# Patient Record
Sex: Female | Born: 1940 | Race: Black or African American | Hispanic: No | Smoking: Never smoker
Health system: Southern US, Community
[De-identification: ages and names within clinical notes are randomized; demographics above are authoritative.]

## PROBLEM LIST (undated history)

## (undated) DIAGNOSIS — E785 Hyperlipidemia, unspecified: Secondary | ICD-10-CM

## (undated) DIAGNOSIS — I10 Essential (primary) hypertension: Secondary | ICD-10-CM

## (undated) DIAGNOSIS — C50919 Malignant neoplasm of unspecified site of unspecified female breast: Secondary | ICD-10-CM

## (undated) HISTORY — PX: HERNIA REPAIR: SHX51

## (undated) HISTORY — PX: CHOLECYSTECTOMY: SHX55

## (undated) HISTORY — PX: MASTECTOMY: SHX3

---

## 2007-11-12 ENCOUNTER — Emergency Department (HOSPITAL_BASED_OUTPATIENT_CLINIC_OR_DEPARTMENT_OTHER): Admission: EM | Admit: 2007-11-12 | Discharge: 2007-11-12 | Payer: Self-pay | Admitting: Emergency Medicine

## 2009-08-20 IMAGING — CR DG CHEST 2V
2 series · 2 of 2 positions shown · non-contrast
Comparison: None

CLINICAL DATA: Near-syncope

CHEST - 2 VIEW

[w chest pa]
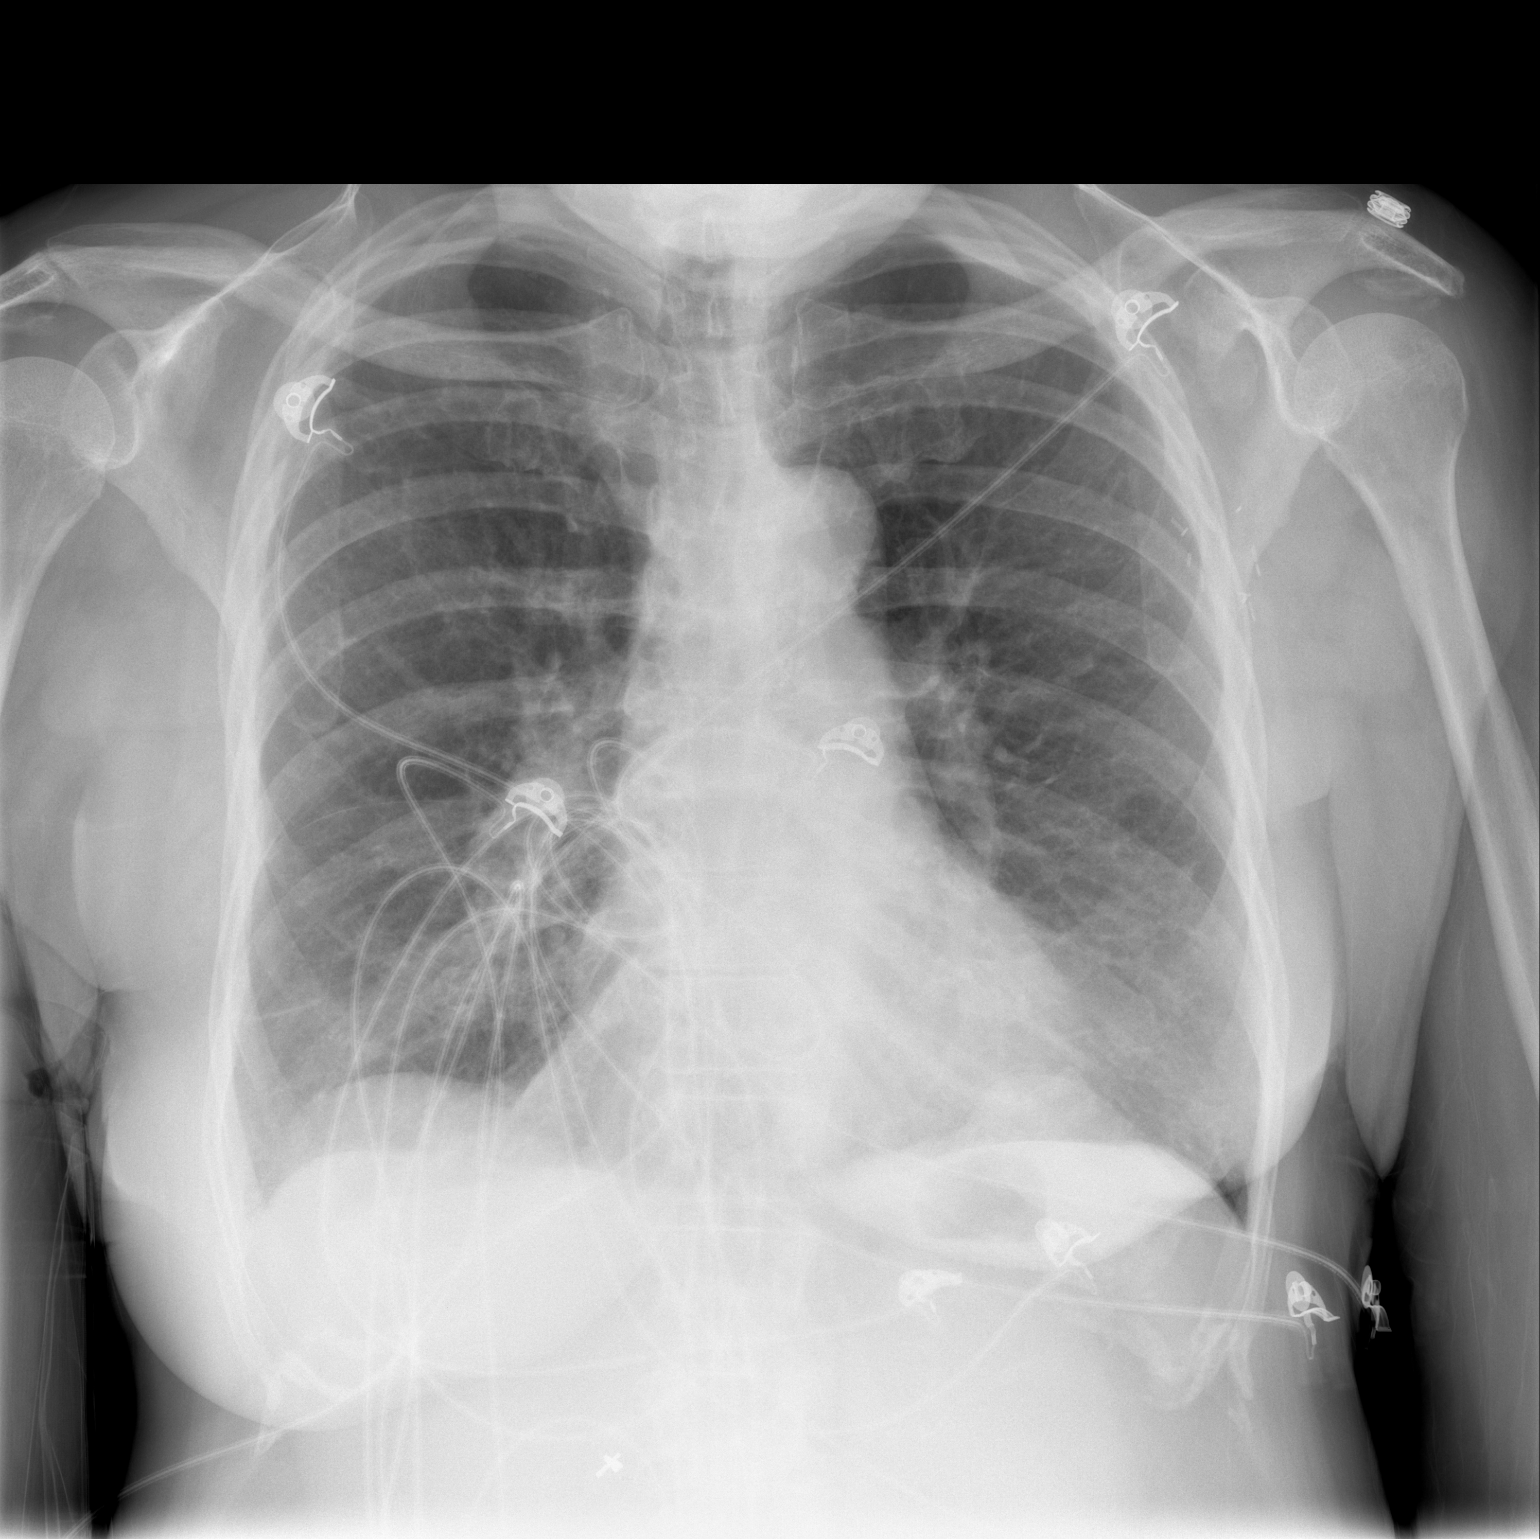

[w chest lat]
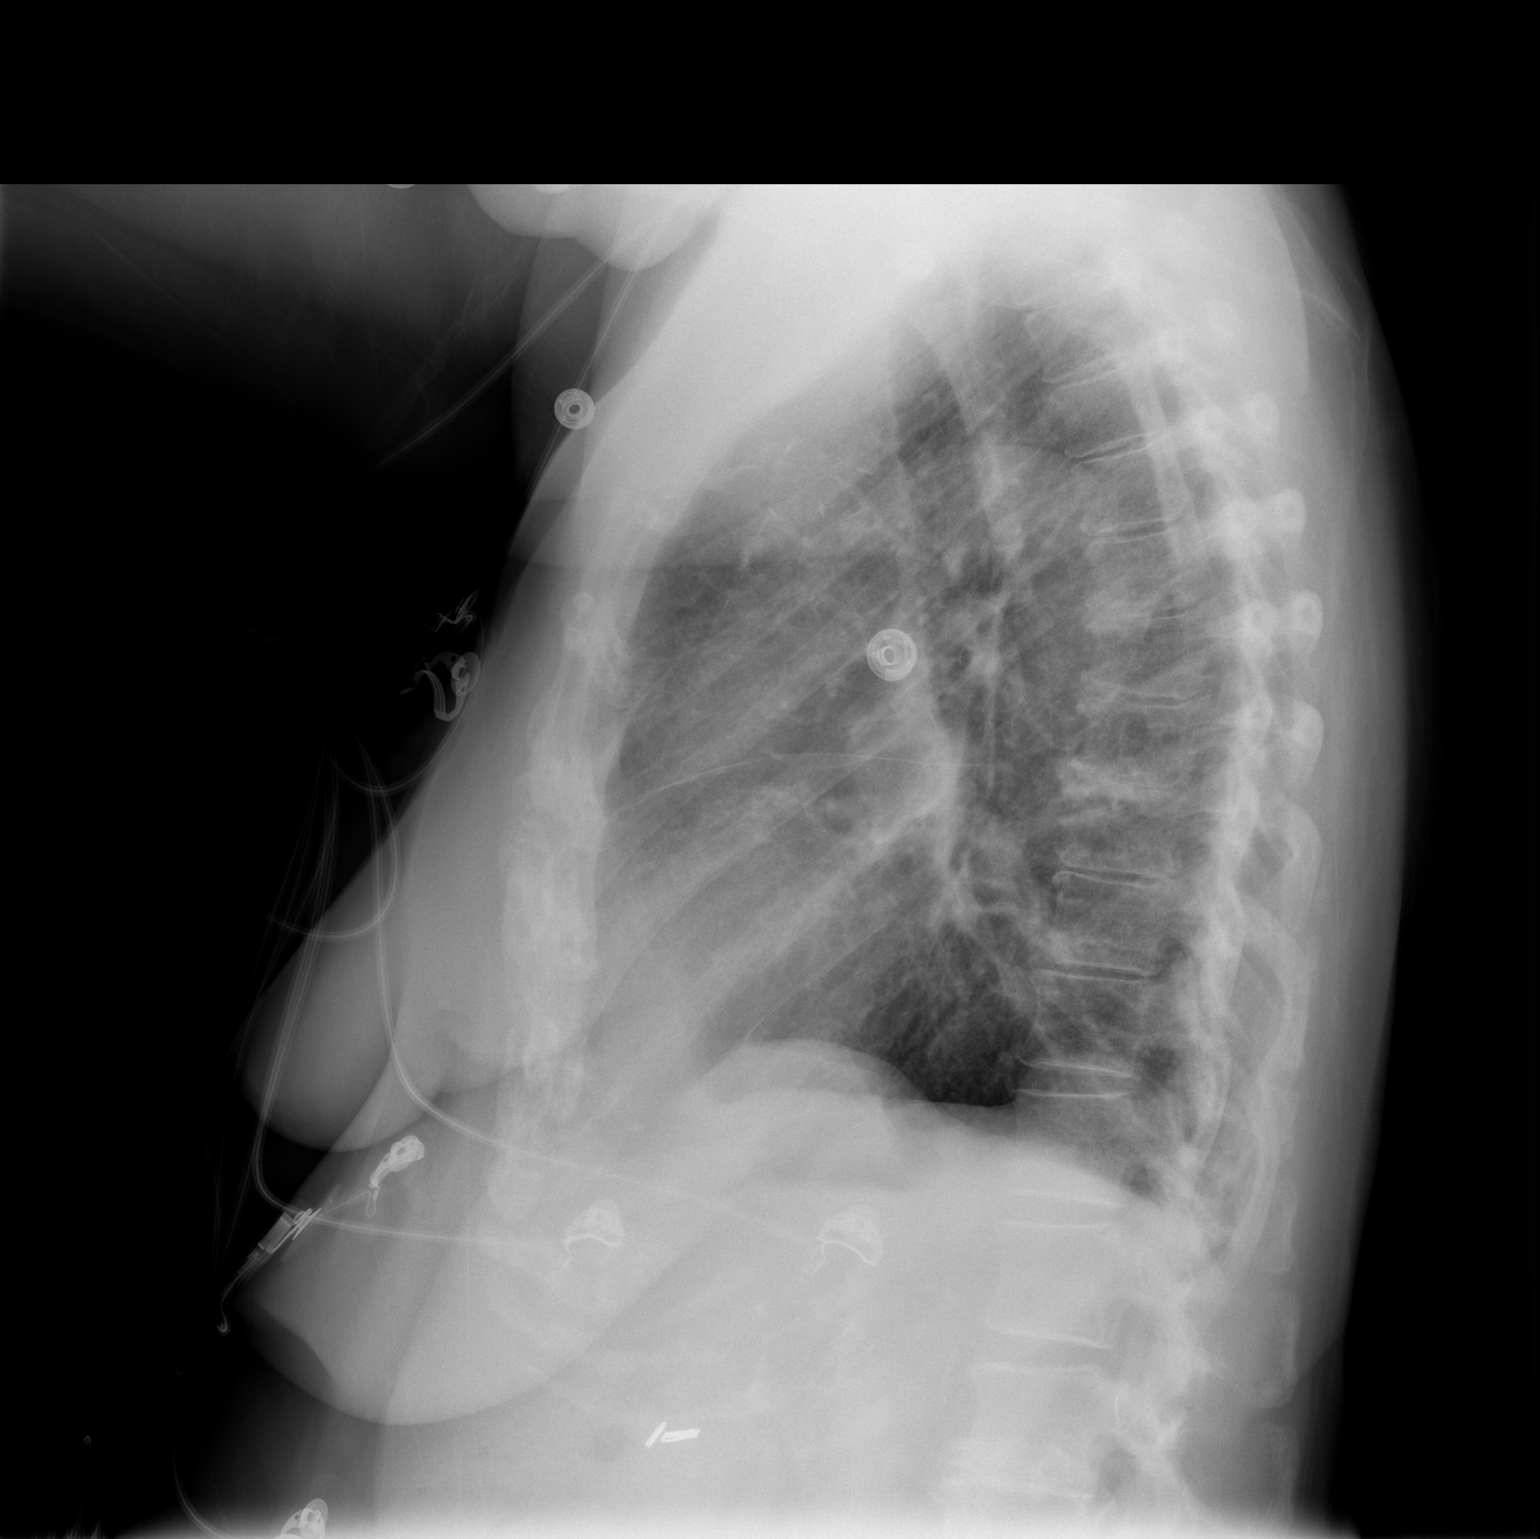

[2 of 2 positions shown; findings below may reference images not displayed]

FINDINGS: Heart size and mediastinal contours are unremarkable.

There is no pleural effusion.

No airspace consolidation identified.

There is mild interstitial prominence concerning for edema.
IMPRESSION: 1.  Suspect mild interstitial edema.

## 2009-08-20 IMAGING — CT CT HEAD W/O CM
1 series · 16 of 30 positions shown, 20 images · non-contrast
Comparison: None

CLINICAL DATA: Syncope.  Hypertension.

CT HEAD WITHOUT CONTRAST
TECHNIQUE: Contiguous axial images were obtained from the base of
the skull through the vertex without contrast.

[Series 2: head 4.8 h37s · axial · 0.42mm/px · z∈[-153,-13]mm · 16 of 32 slices shown, 20 images]
[im 2/32  brain]
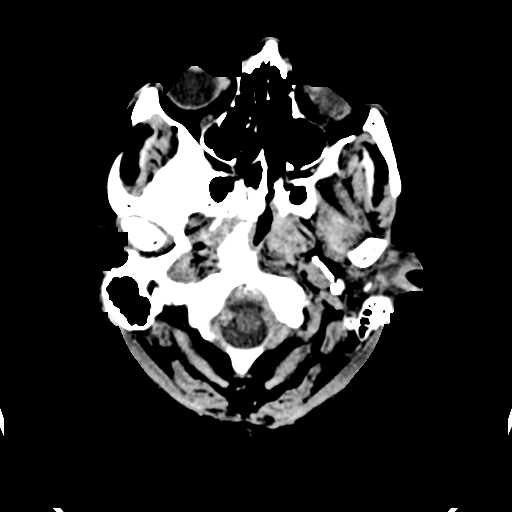
[im 2/32  bone]
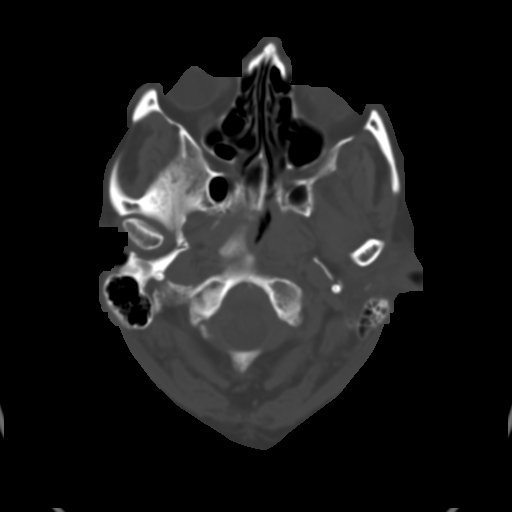
[im 4/32  brain]
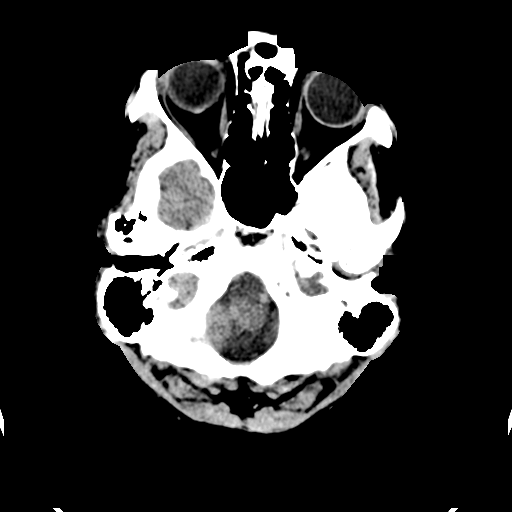
[im 6/32  brain]
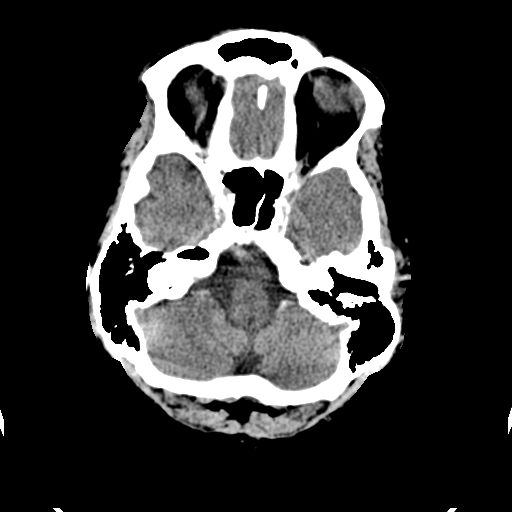
[im 8/32  brain]
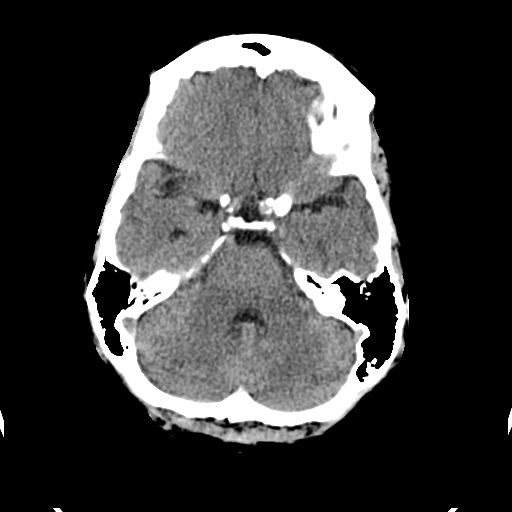
[im 9/32  brain]
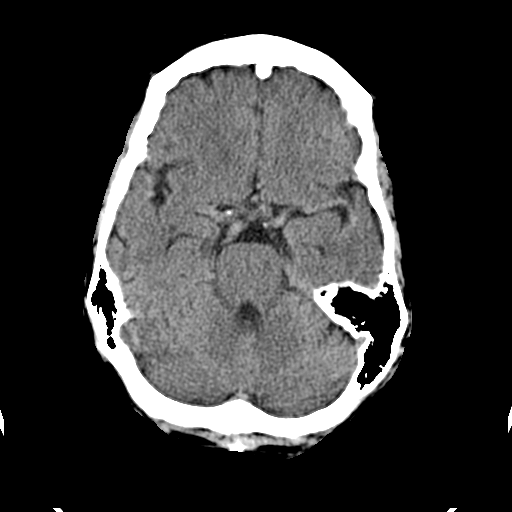
[im 9/32  bone]
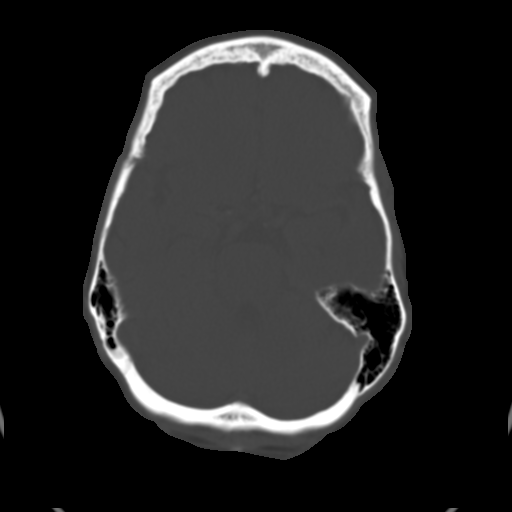
[im 11/32  brain]
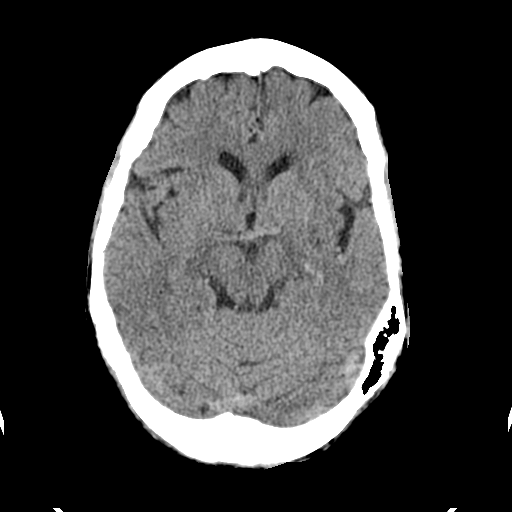
[im 13/32  brain]
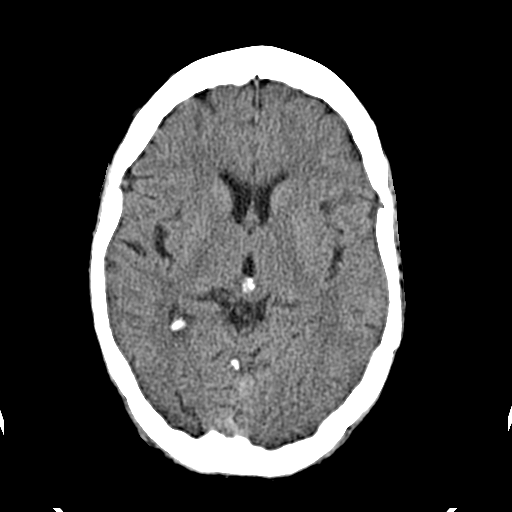
[im 15/32  brain]
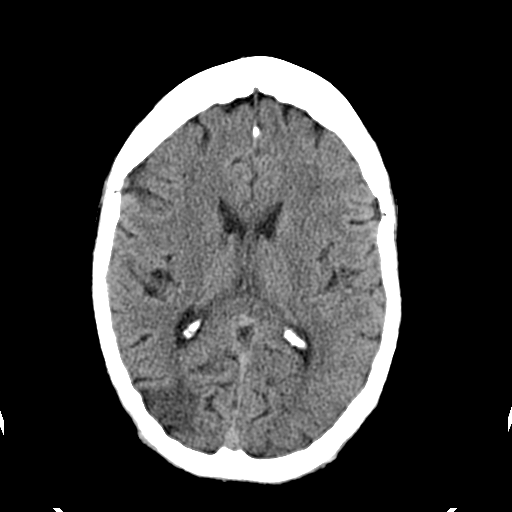
[im 17/32  brain]
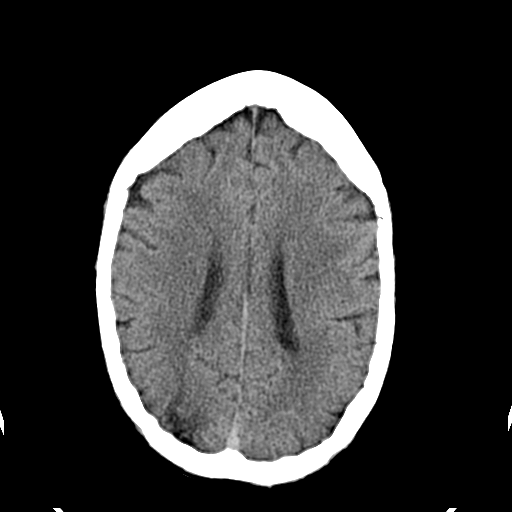
[im 17/32  bone]
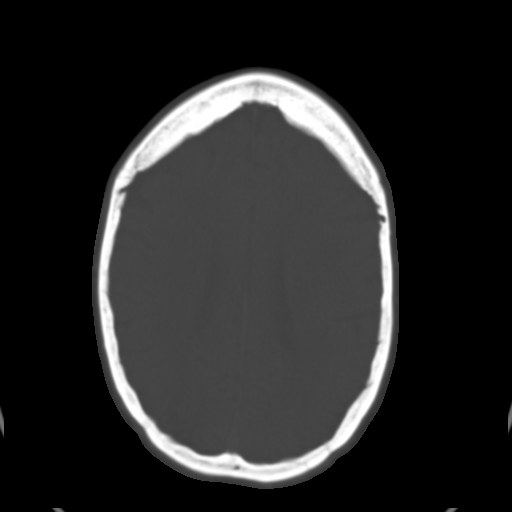
[im 19/32  brain]
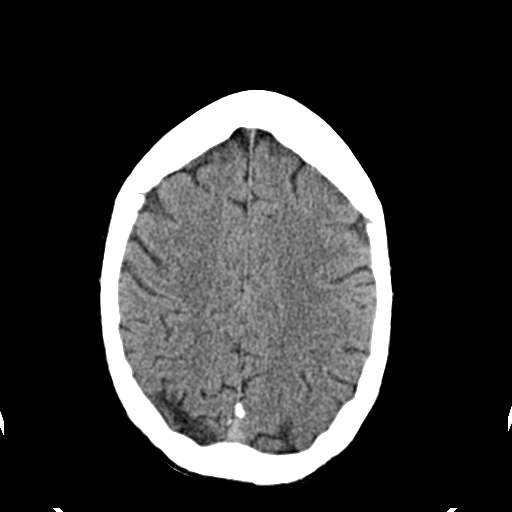
[im 21/32  brain]
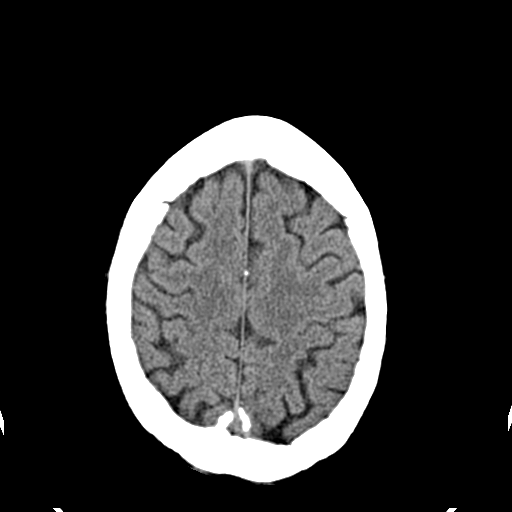
[im 23/32  brain]
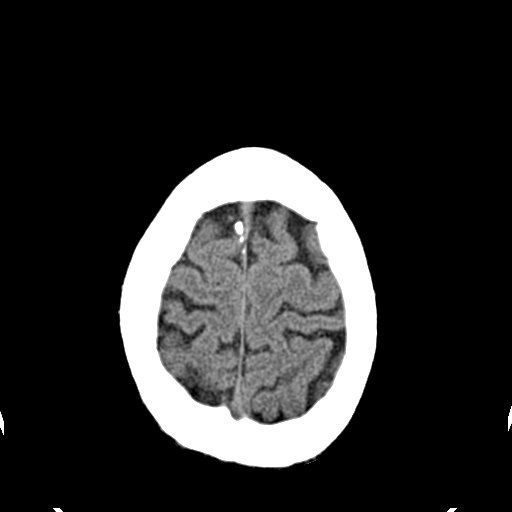
[im 24/32  brain]
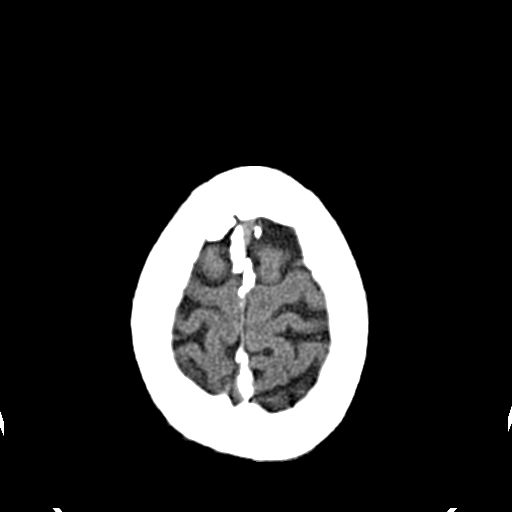
[im 24/32  bone]
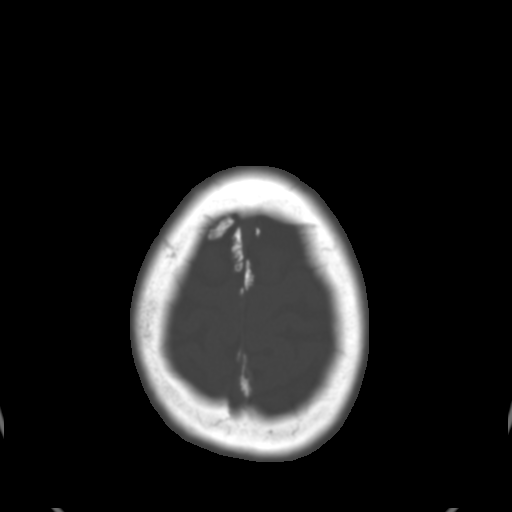
[im 26/32  brain]
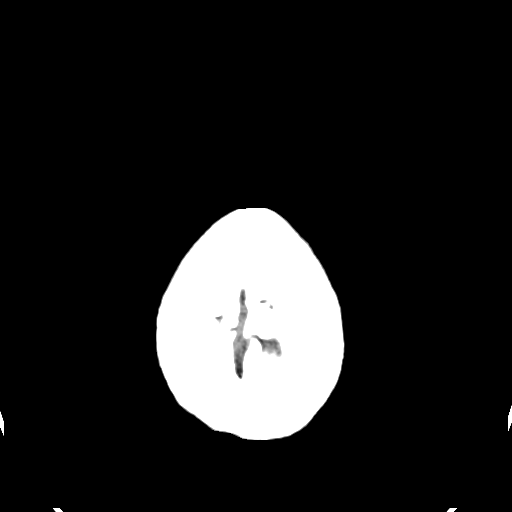
[im 28/32  brain]
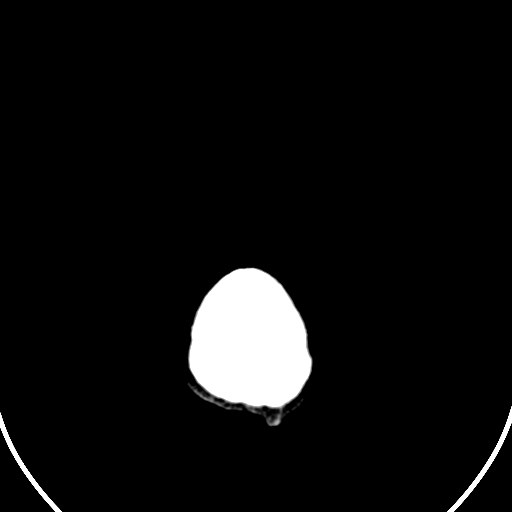
[im 30/32  brain]
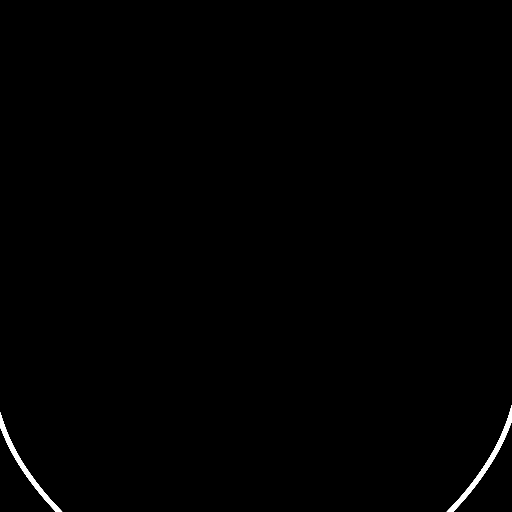

[16 of 30 positions shown; findings below may reference images not displayed]

FINDINGS: There is what appears to be an old infarction in the
right parietal lobe.  Elsewhere, the brain has an unremarkable
appearance without evidence of other infarction, mass lesion,
hemorrhage, hydrocephalus or extra-axial collection.  No
identifiably acute insult.  The paranasal sinuses, middle ears and
mastoids are clear.  No skull fractures seen.
IMPRESSION: Old right parietal infarction.  No acute process evident.

## 2010-12-10 LAB — URINALYSIS, ROUTINE W REFLEX MICROSCOPIC
Bilirubin Urine: NEGATIVE
Glucose, UA: NEGATIVE
Ketones, ur: NEGATIVE
pH: 6

## 2010-12-10 LAB — POCT CARDIAC MARKERS
CKMB, poc: 1.5
CKMB, poc: 2.5
Myoglobin, poc: 119
Myoglobin, poc: 169
Troponin i, poc: <0.05
Troponin i, poc: <0.05

## 2010-12-10 LAB — DIFFERENTIAL
Basophils Absolute: 0.2 — ABNORMAL HIGH
Basophils Relative: 3 — ABNORMAL HIGH
Eosinophils Absolute: 0.1
Eosinophils Relative: 2
Lymphocytes Relative: 39
Lymphs Abs: 2.7
Monocytes Absolute: 0.5
Monocytes Relative: 7
Neutro Abs: 3.4
Neutrophils Relative %: 49

## 2010-12-10 LAB — COMPREHENSIVE METABOLIC PANEL WITH GFR
ALT: 18
AST: 34
Albumin: 4.5
Alkaline Phosphatase: 66
BUN: 19
CO2: 30
Calcium: 9.9
Chloride: 101
Creatinine, Ser: 0.9
GFR calc non Af Amer: >60
Glucose, Bld: 148 — ABNORMAL HIGH
Potassium: 3.5
Sodium: 143
Total Bilirubin: 0.5
Total Protein: 8

## 2010-12-10 LAB — CBC
HCT: 41.7
Hemoglobin: 14.3
MCHC: 34.4
MCV: 93.5
RDW: 11.9

## 2010-12-10 LAB — PROTIME-INR
INR: 1
Prothrombin Time: 13.2

## 2010-12-10 LAB — OCCULT BLOOD X 1 CARD TO LAB, STOOL: Fecal Occult Bld: POSITIVE

## 2012-11-11 ENCOUNTER — Emergency Department (HOSPITAL_BASED_OUTPATIENT_CLINIC_OR_DEPARTMENT_OTHER): Payer: Medicare Other

## 2012-11-11 ENCOUNTER — Emergency Department (HOSPITAL_BASED_OUTPATIENT_CLINIC_OR_DEPARTMENT_OTHER)
Admission: EM | Admit: 2012-11-11 | Discharge: 2012-11-11 | Disposition: A | Discharge status: Home / Self Care | Payer: Medicare Other | Attending: Emergency Medicine | Admitting: Emergency Medicine

## 2012-11-11 ENCOUNTER — Encounter (HOSPITAL_BASED_OUTPATIENT_CLINIC_OR_DEPARTMENT_OTHER): Payer: Self-pay | Admitting: *Deleted

## 2012-11-11 DIAGNOSIS — M5136 Other intervertebral disc degeneration, lumbar region: Secondary | ICD-10-CM

## 2012-11-11 DIAGNOSIS — Z853 Personal history of malignant neoplasm of breast: Secondary | ICD-10-CM | POA: Insufficient documentation

## 2012-11-11 DIAGNOSIS — M51379 Other intervertebral disc degeneration, lumbosacral region without mention of lumbar back pain or lower extremity pain: Secondary | ICD-10-CM | POA: Insufficient documentation

## 2012-11-11 DIAGNOSIS — E785 Hyperlipidemia, unspecified: Secondary | ICD-10-CM | POA: Insufficient documentation

## 2012-11-11 DIAGNOSIS — M5137 Other intervertebral disc degeneration, lumbosacral region: Secondary | ICD-10-CM | POA: Insufficient documentation

## 2012-11-11 DIAGNOSIS — I10 Essential (primary) hypertension: Secondary | ICD-10-CM | POA: Insufficient documentation

## 2012-11-11 DIAGNOSIS — Z79899 Other long term (current) drug therapy: Secondary | ICD-10-CM | POA: Insufficient documentation

## 2012-11-11 HISTORY — DX: Hyperlipidemia, unspecified: E78.5

## 2012-11-11 HISTORY — DX: Malignant neoplasm of unspecified site of unspecified female breast: C50.919

## 2012-11-11 HISTORY — DX: Essential (primary) hypertension: I10

## 2012-11-11 MED ORDER — HYDROCODONE-ACETAMINOPHEN 5-325 MG PO TABS: 1.0000 | ORAL_TABLET | Freq: Four times a day (QID) | ORAL | Status: AC | PRN

## 2012-11-11 MED ORDER — HYDROCODONE-ACETAMINOPHEN 5-325 MG PO TABS
1.0000 | ORAL_TABLET | Freq: Once | ORAL | Status: AC
  Administered 2012-11-11: 1 via ORAL
  Filled 2012-11-11: qty 1

## 2012-11-11 NOTE — ED Notes (Signed)
MD at bedside. 

## 2012-11-11 NOTE — ED Provider Notes (Signed)
CSN: [id number]     Arrival date & time 11/11/12  1615 History   First MD Initiated Contact with Patient 11/11/12 1618     Chief Complaint  Patient presents with  . Back Pain    HPI Patient presents to the emergency room with complaints of lower back pain radiating down towards her right leg for about the last week. Patient states it's an aching pain at times will become more severe. The pain increases whenever he tries to move around or walk. The patient denies any weakness. She does feel like her leg is a little bit numb. She denies any bowel or bladder issues. She denies any fevers or coughing or abdominal pain.  Patient states she had a similar episode in the past. She had an evaluation by her Dr. and was told that everything was okay. Past Medical History  Diagnosis Date  . Hypertension   . Hyperlipemia   . Breast CA    Past Surgical History  Procedure Laterality Date  . Mastectomy    . Cholecystectomy    . Hernia repair     History reviewed. No pertinent family history. History  Substance Use Topics  . Smoking status: Never Smoker   . Smokeless tobacco: Not on file  . Alcohol Use: No   OB History   Grav Para Term Preterm Abortions TAB SAB Ect Mult Living                 Review of Systems  All other systems reviewed and are negative.    Allergies  Ivp dye  Home Medications   Current Outpatient Rx  Name  Route  Sig  Dispense  Refill  . lisinopril (PRINIVIL,ZESTRIL) 10 MG tablet   Oral   Take 10 mg by mouth daily.         . potassium chloride (K-DUR) 10 MEQ tablet   Oral   Take 10 mEq by mouth 2 (two) times daily.         . simvastatin (ZOCOR) 20 MG tablet   Oral   Take 20 mg by mouth every evening.         Marland Kitchen HYDROcodone-acetaminophen (NORCO) 5-325 MG per tablet   Oral   Take 1 tablet by mouth every 6 (six) hours as needed for pain.   16 tablet   0    BP 108/56  Pulse 71  Temp(Src) 97.7 F (36.5 C) (Oral)  Resp 20  Ht 5\' 10"  (1.778 m)   Wt 150 lb (68.04 kg)  BMI 21.52 kg/m2  SpO2 95% Physical Exam  Nursing note and vitals reviewed. Constitutional: She appears well-developed and well-nourished. No distress.  HENT:  Head: Normocephalic and atraumatic.  Right Ear: External ear normal.  Left Ear: External ear normal.  Eyes: Conjunctivae are normal. Right eye exhibits no discharge. Left eye exhibits no discharge. No scleral icterus.  Neck: Neck supple. No tracheal deviation present.  Cardiovascular: Normal rate, regular rhythm and intact distal pulses.   Pulses:      Dorsalis pedis pulses are 1+ on the right side, and 1+ on the left side.  Pulmonary/Chest: Effort normal and breath sounds normal. No stridor. No respiratory distress. She has no wheezes. She has no rales.  Abdominal: Soft. Bowel sounds are normal. She exhibits no distension and no pulsatile midline mass. There is no tenderness. There is no rebound and no guarding.  Musculoskeletal: She exhibits no edema.       Lumbar back: She exhibits tenderness. She  exhibits normal range of motion, no bony tenderness, no swelling and no edema.       Right upper leg: She exhibits no tenderness, no bony tenderness and no swelling.       Right lower leg: She exhibits no tenderness, no bony tenderness and no swelling.  Mild tenderness palpation right sided lumbosacral region; feet are warm without evidence of cyanosis  Neurological: She is alert. She has normal strength. No sensory deficit. Cranial nerve deficit:  no gross defecits noted. She exhibits normal muscle tone. She displays no seizure activity. Coordination normal.  Skin: Skin is warm and dry. No rash noted.  Psychiatric: She has a normal mood and affect.    ED Course  Procedures (including critical care time) Labs Review Labs Reviewed - No data to display Imaging Review Dg Lumbar Spine Complete  11/11/2012   *RADIOLOGY REPORT*  Clinical Data: Low back pain.  LUMBAR SPINE - COMPLETE 4+ VIEW  Comparison: No priors.   Findings: Five views of the lumbar spine demonstrate no definite acute displaced fractures or compression type fractures.  There is severe multilevel degenerative disc disease, most pronounced at L2- L3 and L3-L4.  Mild multilevel facet arthropathy.  No definite defects of the pars interarticularis are noted.  Mild dextroscoliosis of the lumbar spine convex to the right at the level of L2-L3.  Extensive atherosclerosis.  Surgical clips project over the right upper quadrant of the abdomen, suggesting prior cholecystectomy.  There is also a single surgical clip in the right side of the pelvis.  IMPRESSION: 1.  No definite acute radiographic abnormality of the lumbar spine. 2.  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 3.  Mild dextroscoliosis of the lumbar spine. 4.  Atherosclerosis.   Original Report Authenticated By: Trudie Reed, M.D.    MDM   1. Degenerative disc disease, lumbar     Symptoms are suggestive of a radiculopathy.  Fortunately, the patient has no weakness.  Her pain improved after a hydrocodone tablet. Do not feel the patient requires any emergent neurosurgical intervention. I will discharge her home with a prescription for hydrocodone and have her followup with her primary Dr. I discussed the findings and plan with patient and family   Celene Kras, MD 11/11/12 [phone]

## 2012-11-11 NOTE — ED Notes (Signed)
Pt c/o lower right  Back pain which radiates down right leg into calf x 1 week

## 2014-01-29 ENCOUNTER — Encounter: Payer: Self-pay | Admitting: *Deleted

## 2014-01-29 NOTE — Telephone Encounter (Signed)
error 

## 2016-04-09 DEATH — deceased
